# Patient Record
Sex: Male | Born: 2000
Health system: Southern US, Community
[De-identification: ages and names within clinical notes are randomized; demographics above are authoritative.]

## PROBLEM LIST (undated history)

## (undated) DIAGNOSIS — J45909 Unspecified asthma, uncomplicated: Secondary | ICD-10-CM

## (undated) HISTORY — DX: Unspecified asthma, uncomplicated: J45.909

## (undated) HISTORY — PX: TYMPANOSTOMY TUBE PLACEMENT: SHX32

---
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
## 2021-07-17 DIAGNOSIS — R070 Pain in throat: Secondary | ICD-10-CM | POA: Diagnosis not present

## 2021-07-28 ENCOUNTER — Ambulatory Visit: Admit: 2021-07-28 | Discharge status: Against Medical Advice | Payer: Self-pay

## 2021-07-29 DIAGNOSIS — J039 Acute tonsillitis, unspecified: Secondary | ICD-10-CM | POA: Diagnosis not present

## 2021-09-16 DIAGNOSIS — Z20822 Contact with and (suspected) exposure to covid-19: Secondary | ICD-10-CM | POA: Diagnosis not present

## 2021-09-18 ENCOUNTER — Other Ambulatory Visit: Payer: Self-pay

## 2021-09-18 ENCOUNTER — Encounter (HOSPITAL_COMMUNITY): Payer: Self-pay

## 2021-09-18 ENCOUNTER — Emergency Department (HOSPITAL_COMMUNITY)
Admission: EM | Admit: 2021-09-18 | Discharge: 2021-09-18 | Disposition: A | Discharge status: Home / Self Care | Payer: BC Managed Care – PPO | Attending: Emergency Medicine | Admitting: Emergency Medicine

## 2021-09-18 DIAGNOSIS — J029 Acute pharyngitis, unspecified: Secondary | ICD-10-CM | POA: Diagnosis not present

## 2021-09-18 DIAGNOSIS — J45909 Unspecified asthma, uncomplicated: Secondary | ICD-10-CM | POA: Diagnosis not present

## 2021-09-18 DIAGNOSIS — J020 Streptococcal pharyngitis: Secondary | ICD-10-CM | POA: Diagnosis not present

## 2021-09-18 DIAGNOSIS — Z20822 Contact with and (suspected) exposure to covid-19: Secondary | ICD-10-CM | POA: Diagnosis not present

## 2021-09-18 LAB — RESP PANEL BY RT-PCR (FLU A&B, COVID) ARPGX2
Influenza A by PCR: NEGATIVE
Influenza B by PCR: NEGATIVE
SARS Coronavirus 2 by RT PCR: NEGATIVE

## 2021-09-18 MED ORDER — DEXAMETHASONE 10 MG/ML FOR PEDIATRIC ORAL USE
10.0000 mg | Freq: Once | INTRAMUSCULAR | Status: AC
  Administered 2021-09-18: 10 mg via ORAL
  Filled 2021-09-18: qty 1

## 2021-09-18 MED ORDER — LIDOCAINE VISCOUS HCL 2 % MT SOLN: 5.0000 mL | OROMUCOSAL | 0 refills | Status: AC | PRN

## 2021-09-18 MED ORDER — LIDOCAINE VISCOUS HCL 2 % MT SOLN
15.0000 mL | Freq: Once | OROMUCOSAL | Status: AC
  Administered 2021-09-18: 15 mL via OROMUCOSAL
  Filled 2021-09-18: qty 15

## 2021-09-18 MED ORDER — FLUTICASONE PROPIONATE 50 MCG/ACT NA SUSP: 1.0000 | Freq: Every day | NASAL | 2 refills | Status: AC

## 2021-09-18 NOTE — Discharge Instructions (Addendum)
Encourage oral rehydration, eat soft diet as tolerated. Consider protein shake/meal replacement shake if difficulty swallowing   Take motrin (600mg ) every 4-6 hours, tylenol (500mg ) every 4-6 hours alternating as needed for fever/discomfort.  Return to ED if unable to swallowing, drooling, worsening signs or symptoms.

## 2021-09-18 NOTE — ED Provider Notes (Signed)
[facility] EMERGENCY DEPARTMENT Provider Note   CSN: [id number] Arrival date & time: 09/18/21  0747     History Chief Complaint  Patient presents with   Cough    Timothy [name] is a 20 y.o. male.  This is a 20 y.o. male with significant medical history as below, including asthma who presents to the ED with complaint of sore throat, fever.  Patient accompanied by mother.  Patient college student, on Saturday started having sore throat.  Diagnosed with strep pharyngitis on Sunday.  Start oral antibiotics, amoxicillin.  Has been having intermittent fevers, nonproductive cough, sore throat since then.  T-max 103, last antipyretic approximately 7 AM this morning.  Last fever was around 6 AM this morning 102-103.  Patient has been tolerant oral intake although reduced secondary to throat discomfort.  Symptoms improved with oral Tylenol/Motrin.  No exacerbating factors identified.  No nausea or vomiting.  No change in bowel or bladder function.  No recent travel or sick contacts, no back or neck pain.  No mental status changes.  No chest pain or dyspnea.  Mother is concerned that potentially he has contracted the flu or COVID-19 in addition to strep pharyngitis  He had 1 prior strep throat infection within the last 6 months.  The history is provided by the patient and a relative. No language interpreter was used.  Cough Associated symptoms: fever, rhinorrhea and sore throat   Associated symptoms: no chest pain, no chills, no headaches, no rash and no shortness of breath       Past Medical History:  Diagnosis Date   Asthma     Patient Active Problem List   Diagnosis Date Noted   BMI (body mass index), pediatric, 85% to less than 95% for age 69/26/2021   Wears contact lenses 03/21/2020    Past Surgical History:  Procedure Laterality Date   TYMPANOSTOMY TUBE PLACEMENT         Family History  Problem Relation Age of Onset   Diabetes Maternal Grandmother    Hypertension Maternal  Grandmother    Arthritis Maternal Grandmother    COPD Paternal Grandfather    Pneumonia Paternal Grandfather     Social History   Tobacco Use   Smoking status: Never   Smokeless tobacco: Never  Substance Use Topics   Alcohol use: No   Drug use: No    Home Medications Prior to Admission medications   Medication Sig Start Date End Date Taking? Authorizing Provider  fluticasone (FLONASE) 50 MCG/ACT nasal spray Place 1 spray into both nostrils daily for 7 days. 09/18/21 09/25/21 Yes Tanda Rockers A, DO  lidocaine (XYLOCAINE) 2 % solution Use as directed 5 mLs in the mouth or throat as needed for mouth pain. 09/18/21  Yes Tanda Rockers A, DO  NEOMYCIN-POLYMYXIN-HYDROCORTISONE (CORTISPORIN) 1 % SOLN OTIC solution Apply 1-2 drops to toe BID after soaking 08/20/20   Hyatt, Max T, DPM    Allergies    Patient has no known allergies.  Review of Systems   Review of Systems  Constitutional:  Positive for fever. Negative for chills.  HENT:  Positive for congestion, postnasal drip, rhinorrhea and sore throat. Negative for facial swelling and trouble swallowing.   Eyes:  Negative for photophobia and visual disturbance.  Respiratory:  Positive for cough. Negative for shortness of breath.   Cardiovascular:  Negative for chest pain and palpitations.  Gastrointestinal:  Negative for abdominal pain, nausea and vomiting.  Endocrine: Negative for polydipsia and polyuria.  Genitourinary:  Negative for difficulty urinating and hematuria.  Musculoskeletal:  Negative for gait problem and joint swelling.  Skin:  Negative for pallor and rash.  Neurological:  Negative for syncope and headaches.  Psychiatric/Behavioral:  Negative for agitation and confusion.    Physical Exam Updated Vital Signs BP 125/70 (BP Location: Right Arm)   Pulse 68   Temp 97.7 F (36.5 C) (Oral)   Resp 16   Ht 5\' 9"  (1.753 m)   Wt 72.6 kg   SpO2 100%   BMI 23.63 kg/m   Physical Exam Vitals and nursing note reviewed.   Constitutional:      General: He is not in acute distress.    Appearance: He is well-developed.  HENT:     Head: Normocephalic and atraumatic.     Right Ear: External ear normal.     Left Ear: External ear normal.     Mouth/Throat:     Mouth: Mucous membranes are moist.     Pharynx: Uvula midline.     Tonsils: Tonsillar exudate present.     Comments: White tonsillar exudate bilateral, uvula is midline, no tongue or lip swelling.  No unilateral swelling.  Dentition appears normal Eyes:     General: No scleral icterus.    Extraocular Movements: Extraocular movements intact.     Pupils: Pupils are equal, round, and reactive to light.  Neck:     Comments: No meningismus Cardiovascular:     Rate and Rhythm: Normal rate and regular rhythm.     Pulses: Normal pulses.     Heart sounds: Normal heart sounds.  Pulmonary:     Effort: Pulmonary effort is normal. No respiratory distress.     Breath sounds: Normal breath sounds.  Abdominal:     General: Abdomen is flat.     Palpations: Abdomen is soft.     Tenderness: There is no abdominal tenderness.  Musculoskeletal:        General: Normal range of motion.     Cervical back: Full passive range of motion without pain and normal range of motion.     Right lower leg: No edema.     Left lower leg: No edema.  Skin:    General: Skin is warm and dry.     Capillary Refill: Capillary refill takes less than 2 seconds.  Neurological:     General: No focal deficit present.     Mental Status: He is alert and oriented to person, place, and time. Mental status is at baseline.     GCS: GCS eye subscore is 4. GCS verbal subscore is 5. GCS motor subscore is 6.  Psychiatric:        Mood and Affect: Mood normal.        Behavior: Behavior normal.    ED Results / Procedures / Treatments   Labs (all labs ordered are listed, but only abnormal results are displayed) Labs Reviewed  RESP PANEL BY RT-PCR (FLU A&B, COVID) [id number]     EKG None  Radiology No results found.  Procedures Procedures   Medications Ordered in ED Medications  lidocaine (XYLOCAINE) 2 % viscous mouth solution 15 mL (15 mLs Mouth/Throat Given 09/18/21 1000)  dexamethasone (DECADRON) 10 MG/ML injection for Pediatric ORAL use 10 mg (10 mg Oral Given 09/18/21 1000)    ED Course  I have reviewed the triage vital signs and the nursing notes.  Pertinent labs & imaging results that were available during my care of the patient were reviewed by me and considered  in my medical decision making (see chart for details).    MDM Rules/Calculators/A&P                           CC: Cough, sore throat, fever  This patient complains of above; this involves an extensive number of treatment options and is a complaint that carries with it a high risk of complications and morbidity. Vital signs were reviewed. Serious etiologies considered.  Record review:   Previous records obtained and reviewed   Additional history obtained from mother  Work up as above, notable for:  Lab  results that were available during my care of the patient were reviewed by me and considered in my medical decision making.   Management: Patient given single dose of oral Decadron, oral viscous lidocaine.  Will check for COVID and flu.  Reassessment:  Patient is feeling better after intervention.  Flu and COVID-19 testing negative  Discussed supportive care at home with mother and patient.  Discussed strict return precautions.  Patient is tolerating oral intake, no stridor, drooling or trismus.  Remains afebrile.  Favor strep pharyngitis that was diagnosed a few days ago as etiology of symptoms today. He has some concurrent rhinorrhea / nasal congestion which is likely exacerbating his sore throat. Discussed supportive care  The patient improved significantly and was discharged in stable condition. Detailed discussions were had with the patient regarding current  findings, and need for close f/u with PCP or on call doctor. The patient has been instructed to return immediately if the symptoms worsen in any way for re-evaluation. Patient verbalized understanding and is in agreement with current care plan. All questions answered prior to discharge.        This chart was dictated using voice recognition software.  Despite best efforts to proofread,  errors can occur which can change the documentation meaning.  Final Clinical Impression(s) / ED Diagnoses Final diagnoses:  Strep pharyngitis    Rx / DC Orders ED Discharge Orders          Ordered    lidocaine (XYLOCAINE) 2 % solution  As needed        09/18/21 1024    fluticasone (FLONASE) 50 MCG/ACT nasal spray  Daily        09/18/21 1024             Jeanell Sparrow, DO 09/18/21 1025

## 2021-09-18 NOTE — ED Triage Notes (Signed)
Pt seen and treated at [facility] diagnosed with strep. Patient taking PCN. Concerned with fever of 103. Nasal congestion, cough, sore throat, and fever.

## 2022-03-11 DIAGNOSIS — R059 Cough, unspecified: Secondary | ICD-10-CM | POA: Diagnosis not present

## 2022-03-11 DIAGNOSIS — R0981 Nasal congestion: Secondary | ICD-10-CM | POA: Diagnosis not present

## 2022-03-11 DIAGNOSIS — J029 Acute pharyngitis, unspecified: Secondary | ICD-10-CM | POA: Diagnosis not present

## 2022-03-11 DIAGNOSIS — M791 Myalgia, unspecified site: Secondary | ICD-10-CM | POA: Diagnosis not present

## 2022-05-20 DIAGNOSIS — M25572 Pain in left ankle and joints of left foot: Secondary | ICD-10-CM | POA: Diagnosis not present

## 2022-10-29 DIAGNOSIS — Z20822 Contact with and (suspected) exposure to covid-19: Secondary | ICD-10-CM | POA: Diagnosis not present

## 2022-10-29 DIAGNOSIS — J09X2 Influenza due to identified novel influenza A virus with other respiratory manifestations: Secondary | ICD-10-CM | POA: Diagnosis not present

## 2022-10-29 DIAGNOSIS — J101 Influenza due to other identified influenza virus with other respiratory manifestations: Secondary | ICD-10-CM | POA: Diagnosis not present

## 2022-10-29 DIAGNOSIS — R059 Cough, unspecified: Secondary | ICD-10-CM | POA: Diagnosis not present
